# Patient Record
Sex: Male | Born: 1995
Health system: Southern US, Community
[De-identification: ages and names within clinical notes are randomized; demographics above are authoritative.]

## PROBLEM LIST (undated history)

## (undated) DIAGNOSIS — M6282 Rhabdomyolysis: Secondary | ICD-10-CM

## (undated) DIAGNOSIS — J45909 Unspecified asthma, uncomplicated: Secondary | ICD-10-CM

## (undated) HISTORY — DX: Rhabdomyolysis: M62.82

---
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
## 2016-03-13 IMAGING — DX DG HAND COMPLETE 3+V*R*
3 series · 3 of 3 positions shown · non-contrast
Comparison: None.

CLINICAL DATA: Punched a wall today

EXAM:
RIGHT HAND - COMPLETE 3+ VIEW

[hand pa]
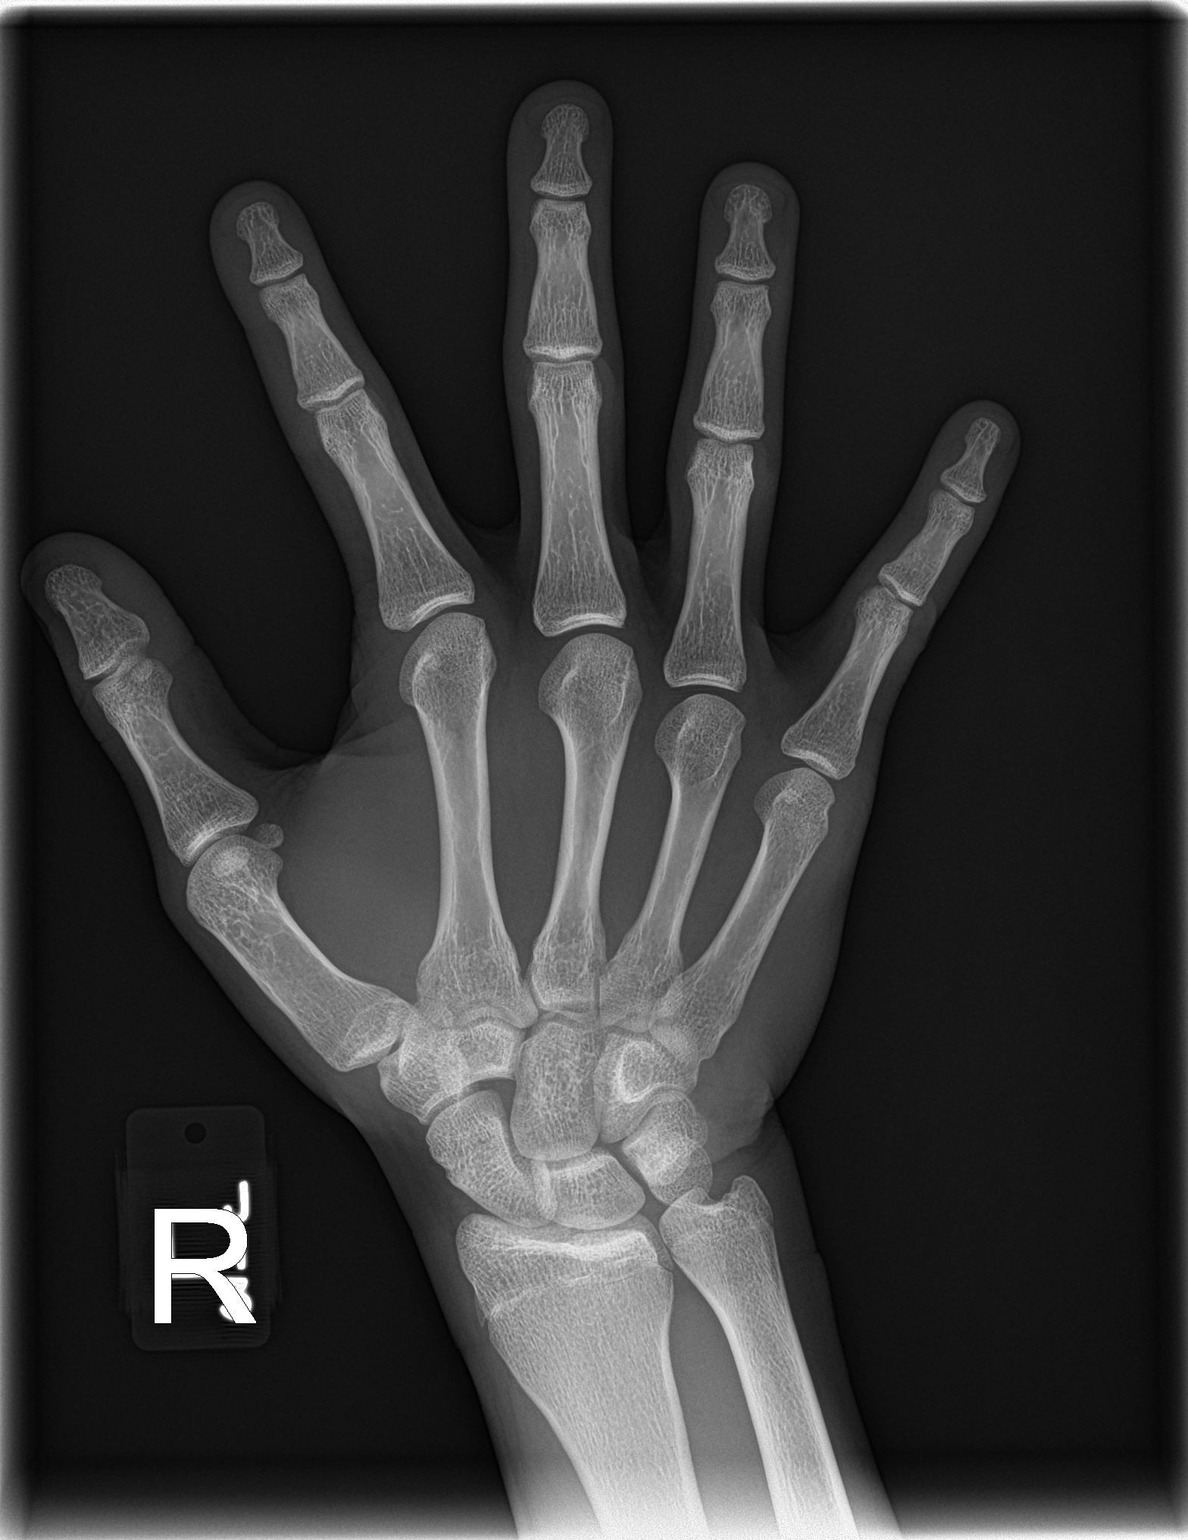

[hand obl]
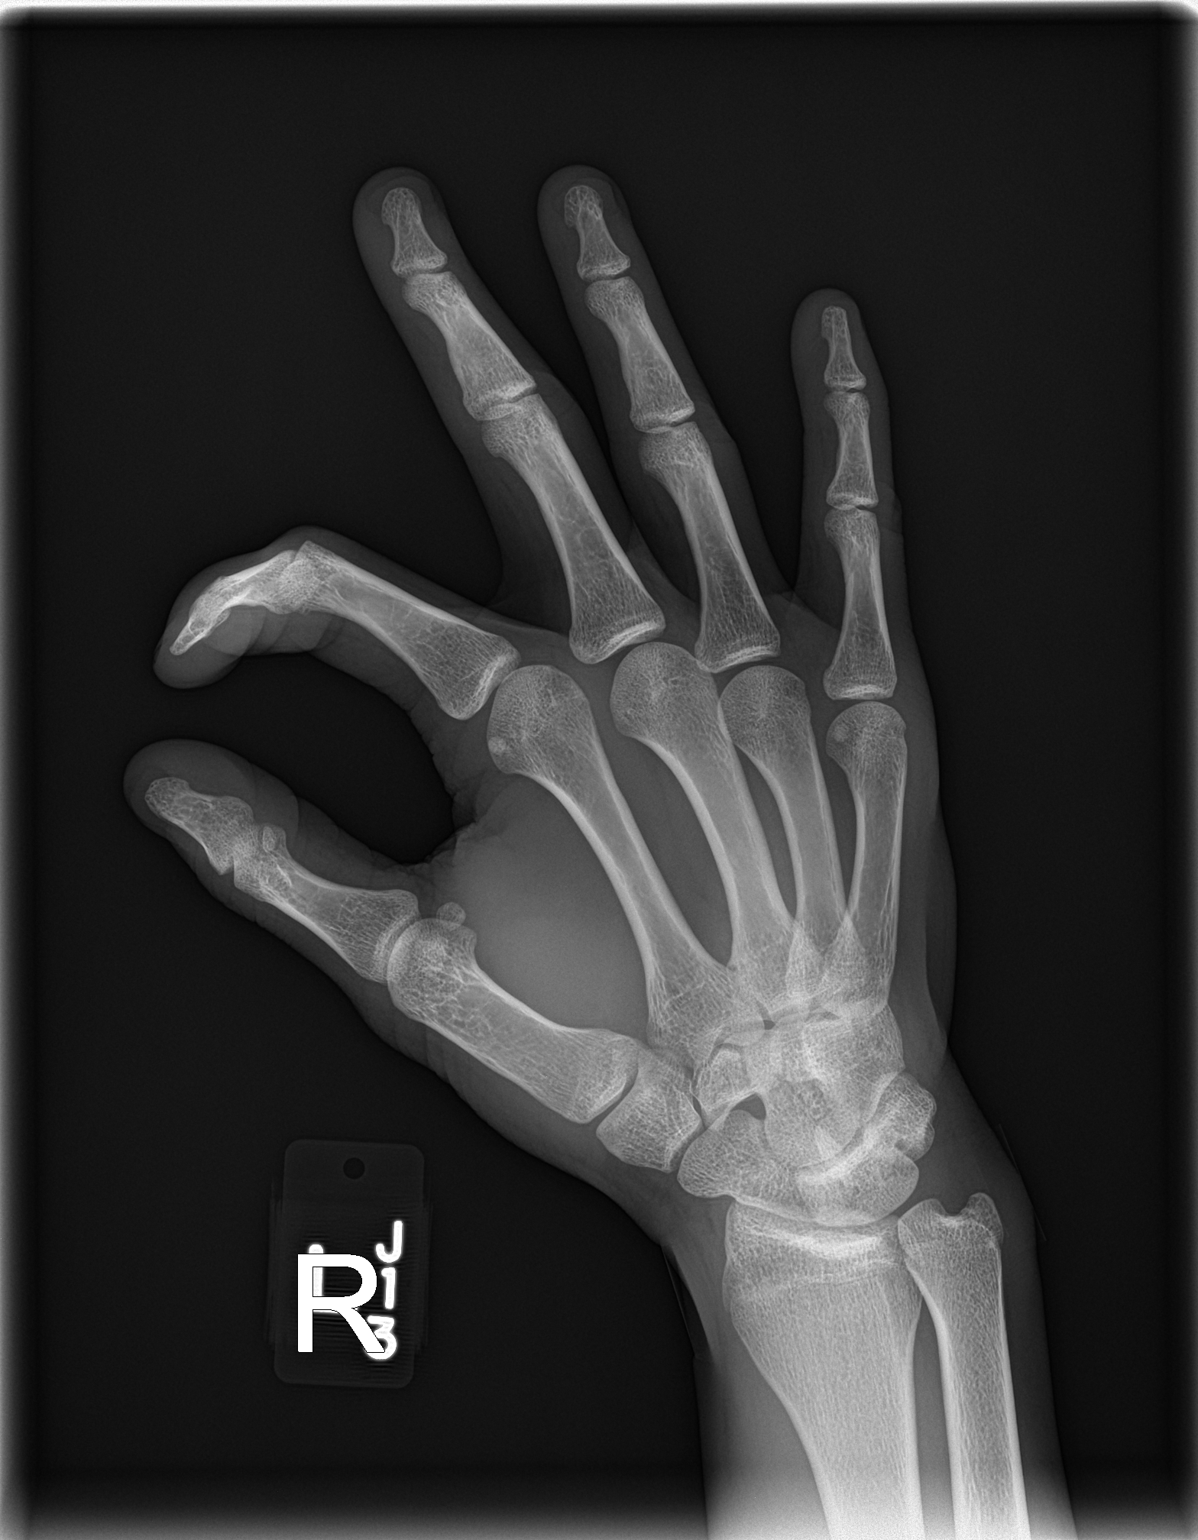

[hand lat]
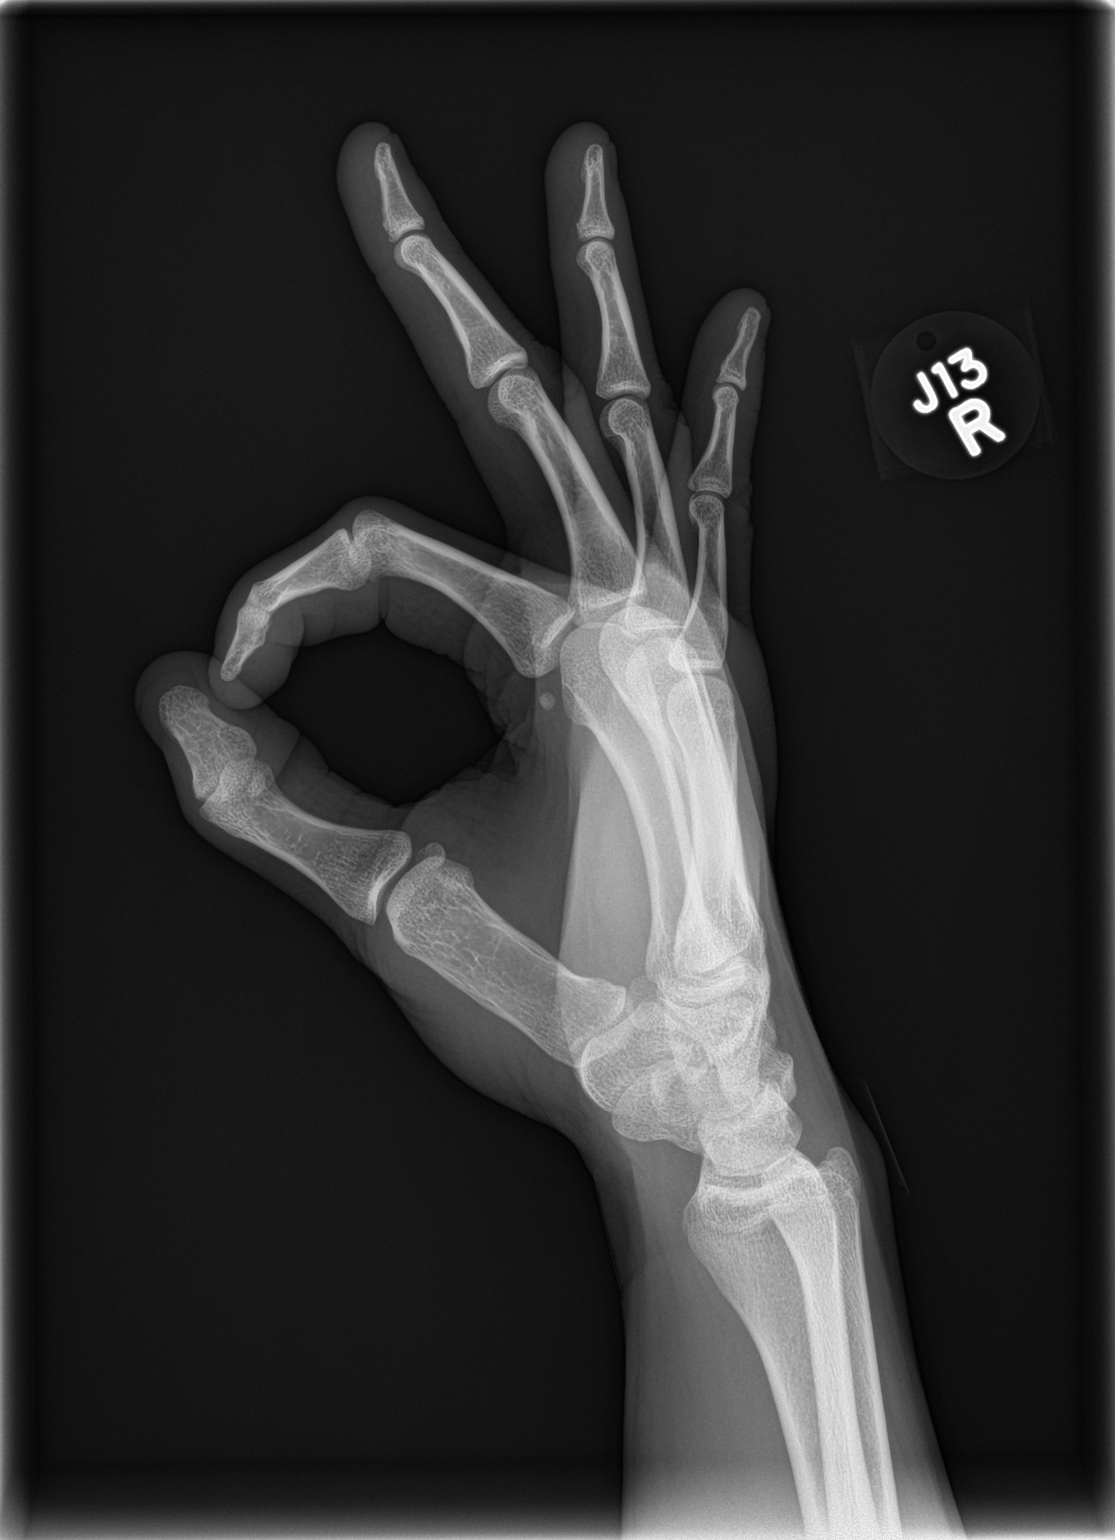

[3 of 3 positions shown; findings below may reference images not displayed]

FINDINGS: Three views of the right hand submitted. No acute fracture or
subluxation. No radiopaque foreign body.
IMPRESSION: Negative.

## 2016-05-06 DIAGNOSIS — E860 Dehydration: Secondary | ICD-10-CM | POA: Diagnosis not present

## 2016-05-06 DIAGNOSIS — T672XXA Heat cramp, initial encounter: Secondary | ICD-10-CM | POA: Diagnosis not present

## 2016-05-06 DIAGNOSIS — F17210 Nicotine dependence, cigarettes, uncomplicated: Secondary | ICD-10-CM | POA: Diagnosis not present

## 2016-12-19 ENCOUNTER — Emergency Department (HOSPITAL_COMMUNITY): Payer: 59

## 2016-12-19 ENCOUNTER — Emergency Department (HOSPITAL_COMMUNITY)
Admission: EM | Admit: 2016-12-19 | Discharge: 2016-12-19 | Disposition: A | Discharge status: Home / Self Care | Payer: 59 | Attending: Emergency Medicine | Admitting: Emergency Medicine

## 2016-12-19 ENCOUNTER — Encounter (HOSPITAL_COMMUNITY): Payer: Self-pay

## 2016-12-19 DIAGNOSIS — S52124A Nondisplaced fracture of head of right radius, initial encounter for closed fracture: Secondary | ICD-10-CM | POA: Insufficient documentation

## 2016-12-19 DIAGNOSIS — Y999 Unspecified external cause status: Secondary | ICD-10-CM | POA: Diagnosis not present

## 2016-12-19 DIAGNOSIS — Y9367 Activity, basketball: Secondary | ICD-10-CM | POA: Insufficient documentation

## 2016-12-19 DIAGNOSIS — Z91010 Allergy to peanuts: Secondary | ICD-10-CM | POA: Diagnosis not present

## 2016-12-19 DIAGNOSIS — F17200 Nicotine dependence, unspecified, uncomplicated: Secondary | ICD-10-CM | POA: Insufficient documentation

## 2016-12-19 DIAGNOSIS — S59911A Unspecified injury of right forearm, initial encounter: Secondary | ICD-10-CM | POA: Diagnosis present

## 2016-12-19 DIAGNOSIS — W1830XA Fall on same level, unspecified, initial encounter: Secondary | ICD-10-CM | POA: Diagnosis not present

## 2016-12-19 DIAGNOSIS — Z79899 Other long term (current) drug therapy: Secondary | ICD-10-CM | POA: Diagnosis not present

## 2016-12-19 DIAGNOSIS — J45909 Unspecified asthma, uncomplicated: Secondary | ICD-10-CM | POA: Diagnosis not present

## 2016-12-19 DIAGNOSIS — Y929 Unspecified place or not applicable: Secondary | ICD-10-CM | POA: Insufficient documentation

## 2016-12-19 HISTORY — DX: Unspecified asthma, uncomplicated: J45.909

## 2016-12-19 MED ORDER — HYDROCODONE-ACETAMINOPHEN 5-325 MG PO TABS: 2.0000 | ORAL_TABLET | ORAL | 0 refills | Status: DC | PRN

## 2016-12-19 MED ORDER — ONDANSETRON 4 MG PO TBDP: 4.0000 mg | ORAL_TABLET | Freq: Once | ORAL | Status: DC

## 2016-12-19 MED ORDER — IBUPROFEN 200 MG PO TABS
400.0000 mg | ORAL_TABLET | Freq: Once | ORAL | Status: AC
  Administered 2016-12-19: 400 mg via ORAL
  Filled 2016-12-19: qty 2

## 2016-12-19 MED ORDER — HYDROCODONE-ACETAMINOPHEN 5-325 MG PO TABS
1.0000 | ORAL_TABLET | Freq: Once | ORAL | Status: AC
  Administered 2016-12-19: 1 via ORAL
  Filled 2016-12-19: qty 1

## 2016-12-19 MED ORDER — IBUPROFEN 600 MG PO TABS: 600.0000 mg | ORAL_TABLET | Freq: Four times a day (QID) | ORAL | 0 refills | Status: DC | PRN

## 2016-12-19 NOTE — ED Notes (Signed)
Bed: WA05 Expected date:  Expected time:  Means of arrival:  Comments: 

## 2016-12-19 NOTE — Discharge Instructions (Signed)
Please call the hand surgeon listed on Monday morning to schedule a follow up appointment.  Ibuprofen as needed for mild to moderate pain. Pain medication only as needed for severe pain - This can make you very drowsy - please do not drink alcohol, operate heavy machinery or drive on this medication.   Check the fingertips several times per day to make sure they are not cold, pale, or blue. If this is the case, please return to ER for recheck. Return to ER for numbness, worsening pain, new or worsening symptoms, any additional concerns.

## 2016-12-19 NOTE — ED Triage Notes (Signed)
Fall while playing basketball about one hour ago now right elbow pain no deformity noted good circulation and sensation noted.

## 2016-12-19 NOTE — Progress Notes (Signed)
Orthopedic Tech Progress Note Patient Details:  Eino FarberJalen M Hester Nov 04, 1996 [id number]  Ortho Devices Type of Ortho Device: Arm sling, Post (long arm) splint Ortho Device/Splint Location: rue Ortho Device/Splint Interventions: Ordered, Application   Trinna PostMartinez, Joleen Stuckert J 12/19/2016, 3:09 AM

## 2016-12-19 NOTE — ED Provider Notes (Signed)
[facility]-EMERGENCY DEPT Provider Note   CSN: [id number] Arrival date & time: 12/19/16  0009  By signing my name below, I, Brian Patterson, attest that this documentation has been prepared under the direction and in the presence of non-physician practitioner, Brian SauerJaime Sebert Stollings, PA-C. Electronically Signed: Linna Darnerussell Patterson, Scribe. 12/19/2016. 1:54 AM.  History   Chief Complaint Chief Complaint  Patient presents with  . Fall    right elbow pain happened about 1 hour PTA no deformity noted.    The history is provided by the patient and a parent. No language interpreter was used.    HPI Comments: Brian Patterson is a right-hand dominant 21 y.o. male who presents to the Emergency Department complaining of sudden onset, constant, right elbow pain s/p falling about an hour PTA. He states he was playing basketball, fell, and landed on his right elbow. He states "I think I tried to catch myself" with his outstretched right arm. Pt denies hitting his head or losing consciousness. He reports severe pain to his right elbow radiating into his right upper arm. Pt states he cannot bend or extend his right elbow secondary to pain. He has received ibuprofen here for his elbow pain with no improvement. Father notes pt has seen an orthopedist in the past as a pediatric patient for left ankle, but never as an adult.   Past Medical History:  Diagnosis Date  . Asthma     There are no active problems to display for this patient.   History reviewed. No pertinent surgical history.     Home Medications    Prior to Admission medications   Medication Sig Start Date End Date Taking? Authorizing Provider  cetirizine (ZYRTEC) 10 MG tablet Take 1 tablet (10 mg total) by mouth daily. For 7 days for rash/itching 12/09/13   Brian ShayJamie Deis, MD  HYDROcodone-acetaminophen (NORCO/VICODIN) 5-325 MG tablet Take 2 tablets by mouth every 4 (four) hours as needed. 12/19/16   Brian Patterson Hubert Derstine, PA-C  hydrocortisone 2.5 % cream Apply  topically 2 (two) times daily. Apply to affected area twice daily for 7 days 12/09/13   Brian ShayJamie Deis, MD  ibuprofen (ADVIL,MOTRIN) 600 MG tablet Take 1 tablet (600 mg total) by mouth every 6 (six) hours as needed. 12/19/16   Brian Patterson Brian Nolton, PA-C    Family History History reviewed. No pertinent family history.  Social History Social History  Substance Use Topics  . Smoking status: Current Every Day Smoker  . Smokeless tobacco: Current User  . Alcohol use No     Allergies   Peanut-containing drug products   Review of Systems Review of Systems  Musculoskeletal: Positive for arthralgias and myalgias.  Skin: Negative for color change.  Neurological: Negative for syncope and numbness.  All other systems reviewed and are negative.    Physical Exam Updated Vital Signs BP 129/84   Pulse 63   Temp 98 F (36.7 C) (Oral)   Resp 18   Ht 6' (1.829 m)   Wt 75.3 kg   SpO2 100%   BMI 22.51 kg/m   Physical Exam  Constitutional: He is oriented to person, place, and time. He appears well-developed and well-nourished. No distress.  HENT:  Head: Normocephalic and atraumatic.  Eyes: Conjunctivae and EOM are normal.  Neck: Neck supple. No tracheal deviation present.  Cardiovascular: Normal rate.   Pulmonary/Chest: Effort normal. No respiratory distress.  Musculoskeletal: He exhibits tenderness.  TTP of the radial aspect of the right elbow. Decreased ROM secondary to pain. Good grip strength.  No TTP of right shoulder or wrist. Sensation intact to medial, ulnar, and radial nerve distributions. 2+ radial pulse. No open wounds.  Neurological: He is alert and oriented to person, place, and time.  Skin: Skin is warm and dry.  Psychiatric: He has a normal mood and affect. His behavior is normal.  Nursing note and vitals reviewed.    ED Treatments / Results  Labs (all labs ordered are listed, but only abnormal results are displayed) Labs Reviewed - No data to display  EKG  EKG  Interpretation None       Radiology Dg Elbow 2 Views Right  Result Date: 12/19/2016 CLINICAL DATA:  Patient fell while playing basketball yesterday. Right elbow injury with pain throughout the right elbow. EXAM: RIGHT ELBOW - 2 VIEW COMPARISON:  None. FINDINGS: There is a right elbow effusion with displacement of the anterior and posterior fat pads. Focal linear lucency demonstrated in the radial head extending to the articular surface consistent with a nondisplaced acute fracture. Distal humerus and proximal ulna appear intact. No dislocation of the elbow. IMPRESSION: Nondisplaced fracture of the right radial head with associated right elbow effusion. Electronically Signed   By: Brian Patterson M.D.   On: 12/19/2016 00:46    Procedures Procedures (including critical care time)  DIAGNOSTIC STUDIES: Oxygen Saturation is 100% on RA, normal by my interpretation.    COORDINATION OF CARE: 2:02 AM Discussed treatment plan with pt and his father at bedside and they agreed to plan.  Medications Ordered in ED Medications  ibuprofen (ADVIL,MOTRIN) tablet 400 mg (400 mg Oral Given 12/19/16 0022)  HYDROcodone-acetaminophen (NORCO/VICODIN) 5-325 MG per tablet 1 tablet (1 tablet Oral Given 12/19/16 0227)     Initial Impression / Assessment and Plan / ED Course  I have reviewed the triage vital signs and the nursing notes.  Pertinent labs & imaging results that were available during my care of the patient were reviewed by me and considered in my medical decision making (see chart for details).    Brian Patterson is a 21 y.o. male who presents to ED for right elbow pain after a fall playing basketball. RUE with 2+ radial pulse, sensation intact, good grip strength. X-ray obtained which shows nondisplaced fracture of the right radial head with associated right elbow effusion. Hand, Dr. Mina Marble, consulted. Splint and follow up in the office. Splint applied and patient re-evaluated following splint  placement. Patient comfortable in splint. Able to moves fingers without difficulty. Good cap refill. No numbness. Strict return precautions discussed with patient and father at bedside. Follow up care discussed. All questions answered.   Patient discussed with Dr. Nicanor Alcon who agrees with treatment plan.   Final Clinical Impressions(s) / ED Diagnoses   Final diagnoses:  Closed nondisplaced fracture of head of right radius, initial encounter    New Prescriptions Discharge Medication List as of 12/19/2016  3:32 AM     I personally performed the services described in this documentation, which was scribed in my presence. The recorded information has been reviewed and is accurate.    [facility] Chi Garlow, PA-C 12/19/16 0541    Cy Blamer, MD 12/19/16 [phone]

## 2016-12-22 DIAGNOSIS — S52125A Nondisplaced fracture of head of left radius, initial encounter for closed fracture: Secondary | ICD-10-CM | POA: Diagnosis not present

## 2017-01-03 DIAGNOSIS — S52125D Nondisplaced fracture of head of left radius, subsequent encounter for closed fracture with routine healing: Secondary | ICD-10-CM | POA: Diagnosis not present

## 2017-04-09 DIAGNOSIS — K2970 Gastritis, unspecified, without bleeding: Secondary | ICD-10-CM | POA: Diagnosis not present

## 2017-04-09 DIAGNOSIS — J45909 Unspecified asthma, uncomplicated: Secondary | ICD-10-CM | POA: Diagnosis not present

## 2017-04-09 DIAGNOSIS — R112 Nausea with vomiting, unspecified: Secondary | ICD-10-CM | POA: Diagnosis not present

## 2017-04-09 DIAGNOSIS — R252 Cramp and spasm: Secondary | ICD-10-CM | POA: Diagnosis not present

## 2017-04-09 DIAGNOSIS — M6282 Rhabdomyolysis: Secondary | ICD-10-CM | POA: Diagnosis not present

## 2017-04-09 DIAGNOSIS — T672XXA Heat cramp, initial encounter: Secondary | ICD-10-CM | POA: Diagnosis not present

## 2017-04-09 DIAGNOSIS — F17210 Nicotine dependence, cigarettes, uncomplicated: Secondary | ICD-10-CM | POA: Diagnosis not present

## 2017-04-09 LAB — BASIC METABOLIC PANEL
BUN: 10 mg/dL (ref 4–21)
Creatinine: 1.4 mg/dL — AB (ref <=1.3)
GLUCOSE: 76 mg/dL
Potassium: 4.1 mmol/L (ref 3.4–5.3)
Sodium: 138 mmol/L (ref 137–147)

## 2017-04-13 ENCOUNTER — Ambulatory Visit (INDEPENDENT_AMBULATORY_CARE_PROVIDER_SITE_OTHER): Payer: 59 | Admitting: Family Medicine

## 2017-04-13 ENCOUNTER — Encounter: Payer: Self-pay | Admitting: Family Medicine

## 2017-04-13 VITALS — BP 116/68 | HR 73 | Temp 97.5°F | Ht 69.0 in | Wt 164.8 lb

## 2017-04-13 DIAGNOSIS — M6282 Rhabdomyolysis: Secondary | ICD-10-CM | POA: Diagnosis not present

## 2017-04-13 HISTORY — DX: Rhabdomyolysis: M62.82

## 2017-04-13 LAB — COMPREHENSIVE METABOLIC PANEL
ALT: 15 U/L (ref 0–53)
AST: 23 U/L (ref 0–37)
Albumin: 4.9 g/dL (ref 3.5–5.2)
Alkaline Phosphatase: 67 U/L (ref 52–171)
BUN: 12 mg/dL (ref 6–23)
CO2: 29 mEq/L (ref 19–32)
Calcium: 10.1 mg/dL (ref 8.4–10.5)
Chloride: 104 mEq/L (ref 96–112)
Creatinine, Ser: 1.17 mg/dL (ref 0.40–1.50)
GFR: 102.46 mL/min (ref >60.00)
Glucose, Bld: 81 mg/dL (ref 70–99)
Potassium: 4.1 mEq/L (ref 3.5–5.1)
Sodium: 138 mEq/L (ref 135–145)
Total Bilirubin: 0.5 mg/dL (ref 0.2–1.2)
Total Protein: 8.1 g/dL (ref 6.0–8.3)

## 2017-04-13 LAB — CK TOTAL AND CKMB (NOT AT ARMC)
CK, MB: 0.9 ng/mL (ref 0.0–5.0)
Relative Index: 0.2 (ref 0.0–4.0)
Total CK: 484 U/L — ABNORMAL HIGH (ref 44–196)

## 2017-04-13 NOTE — Progress Notes (Signed)
Brian Patterson is a 21 y.o. male is here to [facility].   Patient Care Team: Brian Rima, DO as PCP - General (Family Medicine)   History of Present Illness:   Brian Patterson CMA acting as scribe for Dr. Earlene Plater.  HPI Patient comes in to establish care. He has recently been diagnosed with rhabdomyolysis. This was caused by playing softball in the heat, with moderate dehydration. He went to the ER. His total CK was 544, Creatinine was 1.4 on 04/09/17. He was given IV fluids and told to follow up for repeat labs today. No other concerns today.   He has been drinking lots of water. No pain. No medications or supplements. No pertinent personal or family history.  Health Maintenance Due  Topic Date Due  . HIV Screening  07/07/2012  . TETANUS/TDAP  07/07/2016   PMHx, SurgHx, SocialHx, Medications, and Allergies were reviewed in the Visit Navigator and updated as appropriate.   Past Medical History:  Diagnosis Date  . Asthma    No past surgical history on file. No family history on file. Social History  Substance Use Topics  . Smoking status: Current Every Day Smoker  . Smokeless tobacco: Current User  . Alcohol use No   Current Medications and Allergies:   No current outpatient prescriptions on file.  Allergies  Allergen Reactions  . Peanut-Containing Drug Products    Review of Systems:   Review of Systems  Constitutional: Negative for chills, fever and malaise/fatigue.  HENT: Negative for ear pain, sinus pain and sore throat.   Eyes: Negative for blurred vision and double vision.  Respiratory: Negative for cough, shortness of breath and wheezing.   Cardiovascular: Negative for chest pain, palpitations and leg swelling.  Gastrointestinal: Negative for abdominal pain, nausea and vomiting.  Musculoskeletal: Negative for back pain, joint pain and neck pain.  Neurological: Negative for dizziness and headaches.  Psychiatric/Behavioral: Negative for depression,  hallucinations and memory loss.   Vitals:   Vitals:   04/13/17 1437  BP: 116/68  Pulse: 73  Temp: 97.5 F (36.4 C)  TempSrc: Oral  SpO2: 96%  Weight: 164 lb 12.8 oz (74.8 kg)  Height: 5\' 9"  (1.753 m)     Body mass index is 24.34 kg/m.  Physical Exam:   Physical Exam  Constitutional: He is oriented to person, place, and time. He appears well-developed and well-nourished.  HENT:  Head: Normocephalic and atraumatic.  Right Ear: External ear normal.  Left Ear: External ear normal.  Nose: Nose normal.  Mouth/Throat: Oropharynx is clear and moist.  Eyes: Conjunctivae and EOM are normal. Pupils are equal, round, and reactive to light.  Neck: Normal range of motion. Neck supple.  Cardiovascular: Normal rate, regular rhythm, normal heart sounds and intact distal pulses.   Pulmonary/Chest: Effort normal and breath sounds normal.  Abdominal: Soft. Bowel sounds are normal.  Musculoskeletal: Normal range of motion.  Neurological: He is alert and oriented to person, place, and time.  Skin: Skin is warm and dry.  Psychiatric: He has a normal mood and affect. His behavior is normal. Judgment and thought content normal.  Nursing note and vitals reviewed.  Assessment and Plan:   Graycen was seen today for establish care.  Diagnoses and all orders for this visit:  Non-traumatic rhabdomyolysis Comments: No concerns on exam. Likely trending down. STAT labs ordered below. Continue hydration. Orders: -     CK Total (and CKMB) -     Comprehensive metabolic panel   . Reviewed  expectations re: course of current medical issues. . Discussed self-management of symptoms. . Outlined signs and symptoms indicating need for more acute intervention. . Patient verbalized understanding and all questions were answered. Marland Kitchen. Health Maintenance issues including appropriate healthy diet, exercise, and smoking avoidance were discussed with patient. . See orders for this visit as documented in the  electronic medical record. . Patient received an After Visit Summary.  CMA served as Neurosurgeonscribe during this visit. History, Physical, and Plan performed by medical provider. The above documentation has been reviewed and is accurate and complete. Brian RimaErica Triston Lisanti, D.O.  Brian RimaErica Lesette Frary, DO [place] [place] 04/13/2017  Future Appointments Date Time Provider Department Center  04/13/2017 3:15 PM Brian RimaWallace, Marleah Beever, DO [facility] None

## 2017-04-18 ENCOUNTER — Encounter: Payer: Self-pay | Admitting: Family Medicine

## 2017-04-19 ENCOUNTER — Other Ambulatory Visit: Payer: Self-pay

## 2017-04-19 ENCOUNTER — Telehealth: Payer: Self-pay

## 2017-04-19 MED ORDER — VALACYCLOVIR HCL 1 G PO TABS: 1000.0000 mg | ORAL_TABLET | Freq: Three times a day (TID) | ORAL | 0 refills | Status: AC

## 2017-04-19 NOTE — Telephone Encounter (Signed)
I agree with the documentation and plan recorded by [employer] Agner, CMA.  Jarold MottoSamantha Yuliana Vandrunen PA-C 04/19/17

## 2017-04-19 NOTE — Telephone Encounter (Signed)
Patient sent MyChart message with pictures attached that appear to show that he has the shingles.  I spoke with patient and he said the rash is painful and is on his front and back but only on one side of his body.  It is not oozing at this time.  Spoke with Jarold MottoSamantha Worley, PA, and she agreed that patient's rash and symptoms are consistent with the shingles.  Valacyclovir 1000 mg three times daily x7 days was sent to patient's pharmacy.  Advised patient to avoid babies, small children, pregnant women, or anyone who may have a compromised immune system.  Patient verbalized understanding.

## 2017-05-23 ENCOUNTER — Encounter: Payer: Self-pay | Admitting: Family Medicine

## 2017-05-23 NOTE — Telephone Encounter (Signed)
White discharge from penis with some discomfort.  Scheduled acute 07/17 at 02:15pm.   -LL

## 2017-05-24 ENCOUNTER — Other Ambulatory Visit (HOSPITAL_COMMUNITY)
Admission: RE | Admit: 2017-05-24 | Discharge: 2017-05-24 | Disposition: A | Discharge status: Home / Self Care | Payer: 59 | Source: Ambulatory Visit | Attending: Family Medicine | Admitting: Family Medicine

## 2017-05-24 ENCOUNTER — Ambulatory Visit (INDEPENDENT_AMBULATORY_CARE_PROVIDER_SITE_OTHER): Payer: 59 | Admitting: Family Medicine

## 2017-05-24 ENCOUNTER — Encounter: Payer: Self-pay | Admitting: Family Medicine

## 2017-05-24 VITALS — BP 124/78 | HR 51 | Temp 98.4°F | Ht 69.0 in | Wt 163.4 lb

## 2017-05-24 DIAGNOSIS — Z202 Contact with and (suspected) exposure to infections with a predominantly sexual mode of transmission: Secondary | ICD-10-CM | POA: Insufficient documentation

## 2017-05-24 DIAGNOSIS — Z23 Encounter for immunization: Secondary | ICD-10-CM | POA: Diagnosis not present

## 2017-05-24 DIAGNOSIS — A549 Gonococcal infection, unspecified: Secondary | ICD-10-CM | POA: Diagnosis not present

## 2017-05-24 MED ORDER — CEFTRIAXONE SODIUM 250 MG IJ SOLR
250.0000 mg | Freq: Once | INTRAMUSCULAR | Status: AC
  Administered 2017-05-24: 250 mg via INTRAMUSCULAR

## 2017-05-24 MED ORDER — MENINGOCOCCAL A C Y&W-135 OLIG IM SOLR: 0.5000 mL | Freq: Once | INTRAMUSCULAR | Status: DC

## 2017-05-24 MED ORDER — AZITHROMYCIN 250 MG PO TABS
1000.0000 mg | ORAL_TABLET | Freq: Once | ORAL | Status: AC
  Administered 2017-05-24: 1000 mg via ORAL

## 2017-05-24 NOTE — Progress Notes (Signed)
Brian Patterson is a 21 y.o. male here for an acute visit.  History of Present Illness:   Insurance claims handler, CMA, acting as scribe for Dr. Earlene Plater.  HPI: Patient is here today because he thinks he has an STD.  Symptoms began 1-2 weeks ago.  He is having penile discharge and some discomfort.  States his mother also wants him to get the HPV and meningococcal vaccines.  Patient is accompanied in the room today by his grandfather.  PMHx, SurgHx, SocialHx, Medications, and Allergies were reviewed in the Visit Navigator and updated as appropriate.  Current Medications:  No current outpatient prescriptions on file.  Allergies  Allergen Reactions  . Peanut-Containing Drug Products    Review of Systems:   Pertinent items are noted in the HPI. Otherwise, ROS is negative.  Vitals:   Vitals:   05/24/17 1420  BP: 124/78  Pulse: (!) 51  Temp: 98.4 F (36.9 C)  TempSrc: Oral  SpO2: 98%  Weight: 163 lb 6.4 oz (74.1 kg)  Height: 5\' 9"  (1.753 m)     Body mass index is 24.13 kg/m.  Physical Exam:   Physical Exam  Constitutional: He is oriented to person, place, and time. He appears well-developed and well-nourished. No distress.  HENT:  Head: Normocephalic and atraumatic.  Eyes: Pupils are equal, round, and reactive to light. Conjunctivae and EOM are normal.  Neck: Normal range of motion. Neck supple.  Cardiovascular: Normal rate, regular rhythm, normal heart sounds and intact distal pulses.   Pulmonary/Chest: Effort normal and breath sounds normal.  Abdominal: Soft. Bowel sounds are normal.  Musculoskeletal: Normal range of motion.  Neurological: He is alert and oriented to person, place, and time.  Skin: Skin is warm and dry.  Psychiatric: He has a normal mood and affect. His behavior is normal. Judgment and thought content normal.  Nursing note and vitals reviewed.  Results for orders placed or performed in visit on 05/24/17  HIV antibody  Result Value Ref Range   HIV 1&2 Ab, 4th  Generation NONREACTIVE NONREACTIVE  RPR  Result Value Ref Range   RPR Ser Ql NON REAC NON REAC   Assessment and Plan:   [name] was seen today for acute visit.  Diagnoses and all orders for this visit:  STD exposure -     cefTRIAXone (ROCEPHIN) injection 250 mg; Inject 250 mg into the muscle once. -     Urine cytology ancillary only -     HIV antibody -     RPR -     azithromycin (ZITHROMAX) tablet 1,000 mg; Take 4 tablets (1,000 mg total) by mouth once.  Need for HPV vaccination -     HPV 9-valent vaccine,Recombinat  Need for meningitis vaccination -     MENINGOCOCCAL MCV4O(MENVEO)  . Reviewed expectations re: course of current medical issues. . Discussed self-management of symptoms. . Outlined signs and symptoms indicating need for more acute intervention. . Patient verbalized understanding and all questions were answered. Marland Kitchen Health Maintenance issues including appropriate healthy diet, exercise, and smoking avoidance were discussed with patient. . See orders for this visit as documented in the electronic medical record. . Patient received an After Visit Summary.  CMA served as Neurosurgeon during this visit. History, Physical, and Plan performed by medical provider. The above documentation has been reviewed and is accurate and complete. Helane Rima, D.O.  Helane Rima, DO [place] [place] 05/25/2017  Future Appointments Date Time Provider Department Center  06/24/2017 2:15 PM [facility] NURSE  [facility] None  11/24/2017 2:15 PM [facility] NURSE [facility] None

## 2017-05-25 ENCOUNTER — Encounter: Payer: Self-pay | Admitting: Family Medicine

## 2017-05-25 LAB — HIV ANTIBODY (ROUTINE TESTING W REFLEX): HIV 1&2 Ab, 4th Generation: NONREACTIVE

## 2017-05-25 LAB — RPR

## 2017-05-26 ENCOUNTER — Telehealth: Payer: Self-pay | Admitting: Family Medicine

## 2017-05-26 LAB — URINE CYTOLOGY ANCILLARY ONLY
Chlamydia: NEGATIVE
Neisseria Gonorrhea: POSITIVE — AB
Trichomonas: NEGATIVE

## 2017-05-26 NOTE — Telephone Encounter (Signed)
Patient returning phone call about lab results, I advised patient that they were available on his mychart and to call with any additional questions if necessary.

## 2017-05-26 NOTE — Telephone Encounter (Signed)
Noted  

## 2017-06-24 ENCOUNTER — Ambulatory Visit: Payer: 59

## 2017-11-24 ENCOUNTER — Ambulatory Visit: Payer: 59

## 2017-12-16 ENCOUNTER — Ambulatory Visit: Payer: Self-pay | Admitting: Family Medicine

## 2017-12-17 ENCOUNTER — Encounter (HOSPITAL_COMMUNITY): Payer: Self-pay | Admitting: Emergency Medicine

## 2017-12-17 ENCOUNTER — Ambulatory Visit (HOSPITAL_COMMUNITY)
Admission: EM | Admit: 2017-12-17 | Discharge: 2017-12-17 | Disposition: A | Discharge status: Home / Self Care | Payer: No Typology Code available for payment source | Attending: Internal Medicine | Admitting: Internal Medicine

## 2017-12-17 DIAGNOSIS — J029 Acute pharyngitis, unspecified: Secondary | ICD-10-CM

## 2017-12-17 DIAGNOSIS — R6889 Other general symptoms and signs: Secondary | ICD-10-CM | POA: Diagnosis not present

## 2017-12-17 DIAGNOSIS — R509 Fever, unspecified: Secondary | ICD-10-CM | POA: Insufficient documentation

## 2017-12-17 DIAGNOSIS — Z202 Contact with and (suspected) exposure to infections with a predominantly sexual mode of transmission: Secondary | ICD-10-CM | POA: Insufficient documentation

## 2017-12-17 DIAGNOSIS — J020 Streptococcal pharyngitis: Secondary | ICD-10-CM

## 2017-12-17 DIAGNOSIS — J069 Acute upper respiratory infection, unspecified: Secondary | ICD-10-CM | POA: Insufficient documentation

## 2017-12-17 DIAGNOSIS — J45909 Unspecified asthma, uncomplicated: Secondary | ICD-10-CM | POA: Diagnosis not present

## 2017-12-17 DIAGNOSIS — F17200 Nicotine dependence, unspecified, uncomplicated: Secondary | ICD-10-CM | POA: Insufficient documentation

## 2017-12-17 LAB — POCT RAPID STREP A: STREPTOCOCCUS, GROUP A SCREEN (DIRECT): NEGATIVE

## 2017-12-17 MED ORDER — AMOXICILLIN 500 MG PO CAPS: 500.0000 mg | ORAL_CAPSULE | Freq: Two times a day (BID) | ORAL | 0 refills | Status: AC

## 2017-12-17 MED ORDER — ACETAMINOPHEN 325 MG PO TABS
ORAL_TABLET | ORAL | Status: AC
  Filled 2017-12-17: qty 2

## 2017-12-17 MED ORDER — ACETAMINOPHEN 325 MG PO TABS
650.0000 mg | ORAL_TABLET | Freq: Once | ORAL | Status: AC
  Administered 2017-12-17: 650 mg via ORAL

## 2017-12-17 NOTE — ED Provider Notes (Signed)
[facility]    CSN: [id number] Arrival date & time: 12/17/17  1437     History   Chief Complaint Chief Complaint  Patient presents with  . URI    HPI Brian Patterson is a 22 y.o. male.   Presents with a 3 day history of sore throat, fevers, chills, nausea and vomiting. No cough. Some body aches with his fever. No know exposures. No flu vaccine.       Past Medical History:  Diagnosis Date  . Asthma   . Non-traumatic rhabdomyolysis 04/13/2017   No concerns on exam. Likely trending down. STAT labs ordered below. Continue hydration.    Patient Active Problem List   Diagnosis Date Noted  . STD exposure 05/24/2017    History reviewed. No pertinent surgical history.     Home Medications    Prior to Admission medications   Not on File    Family History History reviewed. No pertinent family history.  Social History Social History   Tobacco Use  . Smoking status: Current Every Day Smoker  . Smokeless tobacco: Current User  Substance Use Topics  . Alcohol use: No  . Drug use: No     Allergies   Peanut-containing drug products   Review of Systems Review of Systems  Constitutional: Positive for chills, fatigue and fever.  HENT: Positive for sore throat. Negative for congestion, rhinorrhea and sinus pain.   Eyes: Negative.   Respiratory: Negative for cough and shortness of breath.   Musculoskeletal: Positive for arthralgias.  Skin: Negative for rash.     Physical Exam Triage Vital Signs ED Triage Vitals  Enc Vitals Group     BP 12/17/17 1607 133/61     Pulse Rate 12/17/17 1607 77     Resp 12/17/17 1607 20     Temp 12/17/17 1607 (!) 102.1 F (38.9 C)     Temp Source 12/17/17 1607 Oral     SpO2 12/17/17 1607 100 %     Weight --      Height --      Head Circumference --      Peak Flow --      Pain Score 12/17/17 1606 10     Pain Loc --      Pain Edu? --      Excl. in GC? --    No data found.  Updated Vital Signs BP 133/61 (BP  Location: Left Arm)   Pulse 77   Temp (!) 102.1 F (38.9 C) (Oral)   Resp 20   SpO2 100%   Visual Acuity Right Eye Distance:   Left Eye Distance:   Bilateral Distance:    Right Eye Near:   Left Eye Near:    Bilateral Near:     Physical Exam  Constitutional: He is oriented to person, place, and time. He appears well-developed and well-nourished. No distress.  Appears ill  HENT:  Right Ear: External ear normal.  Left Ear: External ear normal.  Mouth/Throat: Oropharyngeal exudate present.  +3 tonsils-+exudate and erythema  Neck: Normal range of motion. Neck supple.  Cardiovascular: Regular rhythm.  Pulmonary/Chest: Effort normal and breath sounds normal. He has no wheezes. He has no rales.  Neurological: He is alert and oriented to person, place, and time.  Skin: Skin is warm and dry. He is not diaphoretic.  Nursing note and vitals reviewed.    UC Treatments / Results  Labs (all labs ordered are listed, but only abnormal results are displayed) Labs Reviewed -  No data to display  EKG  EKG Interpretation None       Radiology No results found.  Procedures Procedures (including critical care time)  Medications Ordered in UC Medications  acetaminophen (TYLENOL) tablet 650 mg (650 mg Oral Given 12/17/17 1609)     Initial Impression / Assessment and Plan / UC Course  I have reviewed the triage vital signs and the nursing notes.  Pertinent labs & imaging results that were available during my care of the patient were reviewed by me and considered in my medical decision making (see chart for details).     Clinical presentation of strep. Treat with amoxicillin and supportive care. FU if worsens or concerns.   Final Clinical Impressions(s) / UC Diagnoses   Final diagnoses:  None    ED Discharge Orders    None       Controlled Substance Prescriptions [place] Controlled Substance Registry consulted? Not Applicable   Riki Sheer, PA-C 12/17/17 [id number]

## 2017-12-17 NOTE — ED Triage Notes (Signed)
PT C/O: cold sx onset 3-4 days associated w/nausea, vomiting, fever, HA, sore throat, body aches  TAKING MEDS: Theraflu and OTC cold meds  A&O x4... NAD... Ambulatory

## 2017-12-17 NOTE — Discharge Instructions (Signed)
He could have had the flu and now out of window for treatment, but his clinical exam looks like strep throat and will treat as such. Treat with Amoxicillin x 10 days, Advil 800mg  every 8 hours for fevers and chills. Push fluids and rest. Hope you feel better.

## 2017-12-20 LAB — CULTURE, GROUP A STREP (THRC)

## 2018-01-02 ENCOUNTER — Ambulatory Visit: Payer: No Typology Code available for payment source

## 2018-04-24 ENCOUNTER — Encounter: Payer: Self-pay | Admitting: Family Medicine

## 2018-04-24 ENCOUNTER — Ambulatory Visit (INDEPENDENT_AMBULATORY_CARE_PROVIDER_SITE_OTHER): Payer: No Typology Code available for payment source | Admitting: Family Medicine

## 2018-04-24 VITALS — BP 126/74 | HR 57 | Temp 98.6°F | Ht 69.0 in | Wt 171.4 lb

## 2018-04-24 DIAGNOSIS — J3489 Other specified disorders of nose and nasal sinuses: Secondary | ICD-10-CM | POA: Diagnosis not present

## 2018-04-24 DIAGNOSIS — H00015 Hordeolum externum left lower eyelid: Secondary | ICD-10-CM

## 2018-04-24 MED ORDER — ERYTHROMYCIN 5 MG/GM OP OINT: 1.0000 "application " | TOPICAL_OINTMENT | Freq: Every day | OPHTHALMIC | 0 refills | Status: DC

## 2018-04-24 MED FILL — ERYTHROMYCIN 0.5% EYE OINT: 5 | 20 days supply | Qty: 4 | Fill #0

## 2018-04-24 NOTE — Progress Notes (Signed)
   Subjective:  Brian Patterson is a 22 y.o. male who presents today for same-day appointment with a chief complaint of eyelid lesion.   HPI:  Eyelid lesion, acute problem Started 2 days ago.  Worsened over that time.  Located on lower left eyelid.  Patient has painful.  Also has swelling to the area.  Tried warm compresses which have not helped.  No blurred vision.  No photophobia.  No pain with eye movements.  No other treatments tried.  No obvious precipitating events.  No other obvious alleviating or aggravating factors.  Rhinorrhea, acute problem Started about 1 week ago.has an occasional nonproductive cough.  No shortness of breath.  No fever chills.  No facial pain aside from the lesion above.  ROS: Per HPI  PMH: He reports that he has been smoking.  He uses smokeless tobacco. He reports that he does not drink alcohol or use drugs.  Objective:  Physical Exam: BP 126/74 (BP Location: Right Arm, Patient Position: Sitting, Cuff Size: Normal)   Pulse (!) 57   Temp 98.6 F (37 C) (Oral)   Ht 5\' 9"  (1.753 m)   Wt 171 lb 6.4 oz (77.7 kg)   SpO2 99%   BMI 25.31 kg/m   Gen: NAD, resting comfortably HEENT: Left lower eyelid erythematous and edematous.  Nose mucosa boggy and erythematous bilaterally with watery green discharge.  Assessment/Plan:  Stye/blepharitis No red flag signs or symptoms.  Given surrounding blepharitis, will start topical erythromycin today.  Encouraged patient to continue using warm compresses.  Also recommended gentle massage to the area.  Discussed reasons to return to care.  If no improvement after 1 to 2 weeks, would consider referral to ophthalmology for I&D.  Rhinorrhea Likely secondary to seasonal allergies versus viral URI.  Patient deferred further treatment at this point.  Discussed reasons to return to care.  Follow-up as needed.  Katina Degreealeb M. Jimmey RalphParker, MD 04/24/2018 2:43 PM

## 2018-04-24 NOTE — Patient Instructions (Addendum)
It was very nice to see you today!  Please use the ointment every night.  Continue using warm compresses. You can also gently massage the area to help it drain.  Please let me or Dr Earlene PlaterWallace know if your symptoms worse or do not improve over the next several days.   Take care, Dr Jimmey RalphParker

## 2019-11-08 ENCOUNTER — Encounter (HOSPITAL_COMMUNITY): Payer: Self-pay | Admitting: Emergency Medicine

## 2019-11-08 ENCOUNTER — Emergency Department (HOSPITAL_COMMUNITY)
Admission: EM | Admit: 2019-11-08 | Discharge: 2019-11-09 | Disposition: A | Discharge status: Home / Self Care | Payer: Self-pay | Attending: Emergency Medicine | Admitting: Emergency Medicine

## 2019-11-08 ENCOUNTER — Emergency Department (HOSPITAL_COMMUNITY): Payer: Self-pay

## 2019-11-08 DIAGNOSIS — J45909 Unspecified asthma, uncomplicated: Secondary | ICD-10-CM | POA: Insufficient documentation

## 2019-11-08 DIAGNOSIS — T424X1A Poisoning by benzodiazepines, accidental (unintentional), initial encounter: Secondary | ICD-10-CM | POA: Insufficient documentation

## 2019-11-08 DIAGNOSIS — R0789 Other chest pain: Secondary | ICD-10-CM | POA: Insufficient documentation

## 2019-11-08 DIAGNOSIS — F17290 Nicotine dependence, other tobacco product, uncomplicated: Secondary | ICD-10-CM | POA: Insufficient documentation

## 2019-11-08 DIAGNOSIS — R569 Unspecified convulsions: Secondary | ICD-10-CM | POA: Insufficient documentation

## 2019-11-08 LAB — COMPREHENSIVE METABOLIC PANEL
ALT: 19 U/L (ref 0–44)
AST: 30 U/L (ref 15–41)
Albumin: 4.3 g/dL (ref 3.5–5.0)
Alkaline Phosphatase: 51 U/L (ref 38–126)
Anion gap: 13 (ref 5–15)
BUN: 13 mg/dL (ref 6–20)
CO2: 19 mmol/L — ABNORMAL LOW (ref 22–32)
Calcium: 9.2 mg/dL (ref 8.9–10.3)
Chloride: 103 mmol/L (ref 98–111)
Creatinine, Ser: 1.56 mg/dL — ABNORMAL HIGH (ref 0.61–1.24)
GFR calc Af Amer: >60 mL/min (ref >60)
GFR calc non Af Amer: >60 mL/min (ref >60)
Glucose, Bld: 165 mg/dL — ABNORMAL HIGH (ref 70–99)
Potassium: 3.6 mmol/L (ref 3.5–5.1)
Sodium: 135 mmol/L (ref 135–145)
Total Bilirubin: 0.6 mg/dL (ref 0.3–1.2)
Total Protein: 7.2 g/dL (ref 6.5–8.1)

## 2019-11-08 LAB — TROPONIN I (HIGH SENSITIVITY): Troponin I (High Sensitivity): <2 ng/L (ref <18)

## 2019-11-08 LAB — CBC
HCT: 42.4 % (ref 39.0–52.0)
Hemoglobin: 14.5 g/dL (ref 13.0–17.0)
MCH: 30.6 pg (ref 26.0–34.0)
MCHC: 34.2 g/dL (ref 30.0–36.0)
MCV: 89.5 fL (ref 80.0–100.0)
Platelets: 189 10*3/uL (ref 150–400)
RBC: 4.74 MIL/uL (ref 4.22–5.81)
RDW: 12.3 % (ref 11.5–15.5)
WBC: 8.2 10*3/uL (ref 4.0–10.5)
nRBC: 0 % (ref 0.0–0.2)

## 2019-11-08 MED ORDER — SODIUM CHLORIDE 0.9 % IV BOLUS
1000.0000 mL | Freq: Once | INTRAVENOUS | Status: AC
  Administered 2019-11-08: 1000 mL via INTRAVENOUS

## 2019-11-08 NOTE — ED Notes (Signed)
12-lead reading acute MI, given to MD Bero, no code STEMI per MD

## 2019-11-08 NOTE — ED Triage Notes (Signed)
Pt BIB [facility] had a witnessed seizure lasting 1-2 minutes. No seizure hx. Pt reports taking 4 xanax tonight, denies SI. EMS VSS.

## 2019-11-08 NOTE — ED Provider Notes (Signed)
[facility] Emergency Department Provider Note MRN:  [id number]  Arrival date & time: 11/08/19     Chief Complaint   Seizures   History of Present Illness   Brian Patterson is a 23 y.o. year-old male with a history of asthma presenting to the ED with chief complaint of seizures.  Patient reports having 4 Zani bars this evening.  Report of seizure followed by postictal state.  I was unable to obtain an accurate HPI, PMH, or ROS due to the patient's postictal state.  Level 5 caveat.  Review of Systems  Positive for Xanax use, seizure.  Patient's Health History    Past Medical History:  Diagnosis Date  . Asthma   . Non-traumatic rhabdomyolysis 04/13/2017   No concerns on exam. Likely trending down. STAT labs ordered below. Continue hydration.    History reviewed. No pertinent surgical history.  No family history on file.  Social History   Socioeconomic History  . Marital status: Single    Spouse name: Not on file  . Number of children: Not on file  . Years of education: Not on file  . Highest education level: Not on file  Occupational History  . Not on file  Tobacco Use  . Smoking status: Current Every Day Smoker  . Smokeless tobacco: Current User  Substance and Sexual Activity  . Alcohol use: No  . Drug use: No  . Sexual activity: Yes  Other Topics Concern  . Not on file  Social History Narrative  . Not on file   Social Determinants of Health   Financial Resource Strain:   . Difficulty of Paying Living Expenses: Not on file  Food Insecurity:   . Worried About Charity fundraiser in the Last Year: Not on file  . Ran Out of Food in the Last Year: Not on file  Transportation Needs:   . Lack of Transportation (Medical): Not on file  . Lack of Transportation (Non-Medical): Not on file  Physical Activity:   . Days of Exercise per Week: Not on file  . Minutes of Exercise per Session: Not on file  Stress:   . Feeling of Stress : Not on file   Social Connections:   . Frequency of Communication with Friends and Family: Not on file  . Frequency of Social Gatherings with Friends and Family: Not on file  . Attends Religious Services: Not on file  . Active Member of Clubs or Organizations: Not on file  . Attends Archivist Meetings: Not on file  . Marital Status: Not on file  Intimate Partner Violence:   . Fear of Current or Ex-Partner: Not on file  . Emotionally Abused: Not on file  . Physically Abused: Not on file  . Sexually Abused: Not on file     Physical Exam  Vital Signs and Nursing Notes reviewed Vitals:   11/08/19 2210  BP: 113/73  Resp: 13    CONSTITUTIONAL: Well-appearing, NAD NEURO: Somnolent, wakes to voice, follows commands, normal and symmetric strength and sensation. EYES:  eyes equal and reactive ENT/NECK:  no LAD, no JVD CARDIO: Regular rate, well-perfused, normal S1 and S2 PULM:  CTAB no wheezing or rhonchi GI/GU:  normal bowel sounds, non-distended, non-tender MSK/SPINE:  No gross deformities, no edema SKIN:  no rash, atraumatic PSYCH:  Appropriate speech and behavior  Diagnostic and Interventional Summary    EKG Interpretation  Date/Time:  11-08-2019 at 22: 09: 09 Ventricular Rate:  56 PR Interval: 151  QRS Duration: 98 QT Interval:  438 QTC Calculation: 423 R Axis:     Text Interpretation: Sinus rhythm, normal intervals, subtle ST elevation, likely benign early repolarization Confirmed by Dr. Kennis Carina at 10:40 PM      Labs Reviewed  CBC  COMPREHENSIVE METABOLIC PANEL  LACTIC ACID, PLASMA  URINALYSIS, ROUTINE W REFLEX MICROSCOPIC  RAPID URINE DRUG SCREEN, HOSP PERFORMED  TROPONIN I (HIGH SENSITIVITY)    XR Chest Single View  Final Result    CT Head Wo Contrast    (Results Pending)    Medications  sodium chloride 0.9 % bolus 1,000 mL (has no administration in time range)     Procedures  /  Critical Care Procedures  ED Course and Medical Decision Making  I  have reviewed the triage vital signs and the nursing notes.  Pertinent labs & imaging results that were available during my care of the patient were reviewed by me and considered in my medical decision making (see below for details).     First-time seizure, likely in the setting of drug abuse.  Will evaluate further with labs, urinalysis to rule out other secondary cause of seizure.  Will need to obtain CT head.  Signed out to oncoming provider at shift change.  Anticipating negative work-up, need for metabolization of Xanax, likely discharge.    Elmer Sow. Pilar Plate, MD [facility] [email or web address]  Final Clinical Impressions(s) / ED Diagnoses     ICD-10-CM   1. Seizure (HCC)  R56.9   2. Chest pressure  R07.89 XR Chest Single View    XR Chest Single View  3. Benzodiazepine overdose, accidental or unintentional, initial encounter  T42.4X1A     ED Discharge Orders    None       Discharge Instructions Discussed with and Provided to Patient:   Discharge Instructions   None       Sabas Sous, MD 11/08/19 2247

## 2019-11-08 NOTE — ED Notes (Signed)
Pt reports need to have a BM. D/t Recent witnessed seizure and xanax use, advised pt he would not be able to ambulate to BR at this time. Pt ref'd bedpan/bedside commode. Shortly thereafter pt defecated in stretcher. Pt cleaned up, new pants given. Pt reports he needed to walk to bathroom to complete BM. Risks explained. Pt ambulated to BR independently, explained to pull cord if needs anything.

## 2019-11-09 ENCOUNTER — Emergency Department (HOSPITAL_COMMUNITY): Payer: Self-pay

## 2019-11-09 LAB — TROPONIN I (HIGH SENSITIVITY): Troponin I (High Sensitivity): 5 ng/L (ref <18)

## 2019-11-09 NOTE — ED Provider Notes (Signed)
Patient signed out to me by Dr. Pilar Plate.  Patient had an episode earlier after taking an unknown quantity of Xanax that a bystander thought might have been a seizure.  He does not have a history of seizures.  He has been monitored for an extended period of time here in the ER and has not had anything that looks like seizures.  His work-up was entirely negative.  He is back to a normal neurologic baseline without complaints currently.  Patient denies intentional drug overdose and has no thoughts of self-harm.  Unclear if he actually had a seizure, will provide patient with outpatient follow-up.   Gilda Crease, MD 11/09/19 [phone]

## 2019-11-12 ENCOUNTER — Encounter: Payer: Self-pay | Admitting: Neurology

## 2020-01-16 ENCOUNTER — Ambulatory Visit: Payer: Self-pay | Admitting: Neurology

## 2020-04-25 ENCOUNTER — Emergency Department (HOSPITAL_BASED_OUTPATIENT_CLINIC_OR_DEPARTMENT_OTHER)
Admission: EM | Admit: 2020-04-25 | Discharge: 2020-04-25 | Disposition: A | Discharge status: Home / Self Care | Payer: Self-pay | Attending: Emergency Medicine | Admitting: Emergency Medicine

## 2020-04-25 ENCOUNTER — Other Ambulatory Visit: Payer: Self-pay

## 2020-04-25 ENCOUNTER — Encounter (HOSPITAL_BASED_OUTPATIENT_CLINIC_OR_DEPARTMENT_OTHER): Payer: Self-pay

## 2020-04-25 DIAGNOSIS — R300 Dysuria: Secondary | ICD-10-CM | POA: Insufficient documentation

## 2020-04-25 DIAGNOSIS — J45909 Unspecified asthma, uncomplicated: Secondary | ICD-10-CM | POA: Insufficient documentation

## 2020-04-25 DIAGNOSIS — F17210 Nicotine dependence, cigarettes, uncomplicated: Secondary | ICD-10-CM | POA: Insufficient documentation

## 2020-04-25 LAB — URINALYSIS, ROUTINE W REFLEX MICROSCOPIC
Bilirubin Urine: NEGATIVE
Glucose, UA: NEGATIVE mg/dL
Hgb urine dipstick: NEGATIVE
Ketones, ur: NEGATIVE mg/dL
Leukocytes,Ua: NEGATIVE
Nitrite: NEGATIVE
Protein, ur: NEGATIVE mg/dL
Specific Gravity, Urine: 1.025 (ref 1.005–1.030)
pH: 6 (ref 5.0–8.0)

## 2020-04-25 MED ORDER — CEFTRIAXONE SODIUM 500 MG IJ SOLR
500.0000 mg | Freq: Once | INTRAMUSCULAR | Status: AC
Start: 2020-04-25 — End: 2020-04-25
  Administered 2020-04-25: 500 mg via INTRAMUSCULAR
  Filled 2020-04-25: qty 500

## 2020-04-25 MED ORDER — DOXYCYCLINE HYCLATE 100 MG PO CAPS: 100.0000 mg | ORAL_CAPSULE | Freq: Two times a day (BID) | ORAL | 0 refills | Status: AC

## 2020-04-25 MED ORDER — LIDOCAINE HCL (PF) 1 % IJ SOLN
INTRAMUSCULAR | Status: AC
  Administered 2020-04-25: 1 mL
  Filled 2020-04-25: qty 5

## 2020-04-25 MED ORDER — DOXYCYCLINE HYCLATE 100 MG PO TABS
100.0000 mg | ORAL_TABLET | Freq: Once | ORAL | Status: AC
  Administered 2020-04-25: 100 mg via ORAL
  Filled 2020-04-25: qty 1

## 2020-04-25 NOTE — ED Triage Notes (Signed)
Pt c/o dysuria and penile d/c x 1 week-NAD-steady gait

## 2020-04-25 NOTE — Discharge Instructions (Signed)
Thank you for allowing [place]  to provide your care today in the emergency department.  Your gonorrhea and chlamydia testing is pending.  You were given a shot here in the emergency department and will be discharged home with a prescription for doxycycline.  If your testing comes back negative you can stop the doxycycline.  If it is positive continue pills until prescription is complete.   If any of these tests are positive, someone from the hospital will call you to inform you. You can also download the my chart application and use the number on your discharge paperwork to register for an account.  Lab results are typically available in the app 72 hours after they have resulted.  [facility] recommends abstaining from all sexual activities for 7 days after being treated.  If any of your tests are positive, it is important that you let all of your sexual partners know so they can seek treatment.  It is also important to note that if you were treated and have sex with someone that has not been treated that you can be reinfected.  Return to the emergency department if you develop significantly worsening symptoms such as fever, chills, severe pain, redness, or swelling to the penis or testicles, or if the discharge from your penis does not improve.

## 2020-04-25 NOTE — ED Notes (Signed)
ED Provider at bedside. 

## 2020-04-25 NOTE — ED Provider Notes (Signed)
[facility] EMERGENCY DEPARTMENT Provider Note   CSN: [id number] Arrival date & time: 04/25/20  1617     History Chief Complaint  Patient presents with  . Dysuria    Brian Patterson is a 24 y.o. male with past medical history significant for asthma and STD in the past that was treated.  HPI Presents to emergency department today with chief complaint of dysuria x1 week.  When he describes his symptoms to me he states he is peeing more frequently.  He denies any pain with urination.  He denies seeing any blood in his urine.  He noticed clear discharge from his penis today.  He sexually active with one male partner without protection.  He denies any fever, chills, abdominal pain, nausea, vomiting, back pain, testicular pain or swelling. Also denies any polydipsia or history of diabetes.    Past Medical History:  Diagnosis Date  . Asthma   . Non-traumatic rhabdomyolysis 04/13/2017   No concerns on exam. Likely trending down. STAT labs ordered below. Continue hydration.    Patient Active Problem List   Diagnosis Date Noted  . STD exposure 05/24/2017    History reviewed. No pertinent surgical history.     No family history on file.  Social History   Tobacco Use  . Smoking status: Current Every Day Smoker    Types: Cigarettes  . Smokeless tobacco: Never Used  Vaping Use  . Vaping Use: Never used  Substance Use Topics  . Alcohol use: No  . Drug use: Yes    Types: Marijuana    Home Medications Prior to Admission medications   Medication Sig Start Date End Date Taking? Authorizing Provider  doxycycline (VIBRAMYCIN) 100 MG capsule Take 1 capsule (100 mg total) by mouth 2 (two) times daily for 7 days. 04/25/20 05/02/20  Terrance Usery, Harley Hallmark, PA-C    Allergies    Peanut-containing drug products  Review of Systems   Review of Systems  All other systems are reviewed and are negative for acute change except as noted in the HPI.   Physical Exam Updated Vital  Signs BP 121/69 (BP Location: Left Arm)   Pulse 79   Temp 99 F (37.2 C) (Oral)   Resp 18   Ht 5\' 9"  (1.753 m)   Wt 74.4 kg   SpO2 100%   BMI 24.22 kg/m   Physical Exam Vitals and nursing note reviewed.  Constitutional:      Appearance: He is well-developed. He is not ill-appearing or toxic-appearing.  HENT:     Head: Normocephalic and atraumatic.     Nose: Nose normal.  Eyes:     General: No scleral icterus.       Right eye: No discharge.        Left eye: No discharge.     Conjunctiva/sclera: Conjunctivae normal.  Neck:     Vascular: No JVD.  Cardiovascular:     Rate and Rhythm: Normal rate and regular rhythm.     Pulses: Normal pulses.     Heart sounds: Normal heart sounds.  Pulmonary:     Effort: Pulmonary effort is normal.     Breath sounds: Normal breath sounds.  Abdominal:     General: There is no distension.  Genitourinary:    Comments: Careers adviser present for exam. No discharge or urethritis noted. No signs of sores or lesions or erythema on the penis or testicles. The penis and testicles are nontender. No testicular masses or swelling. No scrotal swelling. No  signs of any inguinal hernias. Cremaster reflex present bilaterally.   Musculoskeletal:        General: Normal range of motion.     Cervical back: Normal range of motion.  Skin:    General: Skin is warm and dry.  Neurological:     Mental Status: He is oriented to person, place, and time.     GCS: GCS eye subscore is 4. GCS verbal subscore is 5. GCS motor subscore is 6.     Comments: Fluent speech, no facial droop.  Psychiatric:        Behavior: Behavior normal.     ED Results / Procedures / Treatments   Labs (all labs ordered are listed, but only abnormal results are displayed) Labs Reviewed  URINALYSIS, ROUTINE W REFLEX MICROSCOPIC  GC/CHLAMYDIA PROBE AMP ([place]) NOT AT [facility]    EKG None  Radiology No results found.  Procedures Procedures (including critical care  time)  Medications Ordered in ED Medications  cefTRIAXone (ROCEPHIN) injection 500 mg (500 mg Intramuscular Given 04/25/20 1730)  doxycycline (VIBRA-TABS) tablet 100 mg (100 mg Oral Given 04/25/20 1731)  lidocaine (PF) (XYLOCAINE) 1 % injection (1 mL  Given 04/25/20 1731)    ED Course  I have reviewed the triage vital signs and the nursing notes.  Pertinent labs & imaging results that were available during my care of the patient were reviewed by me and considered in my medical decision making (see chart for details).    MDM Rules/Calculators/A&P                          History provided by patient with additional history obtained from chart review.    Patient presenting with chief complaint of dysuria however during history and exam patient is patient is admitting to urinary frequency and penile discharge without dysuria. He is afebrile without abdominal tenderness, abdominal pain or painful bowel movements to indicate prostatitis.  No tenderness to palpation of the testes or epididymis to suggest orchitis or epididymitis.  STD cultures obtained including gonorrhea and chlamydia. He declines HIV and syphilis testing. UA is normal, no signs of urinary tract infection, no bacteria. Patient to be discharged with instructions to follow up with PCP. Discussed importance of using protection when sexually active. Pt understands that they have GC/Chlamydia cultures pending and that they will need to inform all sexual partners if results return positive. Patient has been treated prophylactically with Rocephin and given dose of doxycycline in the ED with prescription sent to pharmacy for doxycycline per CDC guidelines. Strict return precautions discussed. Recommend pcp follow up if symptoms persist.    Portions of this note were generated with Dragon dictation software. Dictation errors may occur despite best attempts at proofreading.  Final Clinical Impression(s) / ED Diagnoses Final diagnoses:    Dysuria    Rx / DC Orders ED Discharge Orders         Ordered    doxycycline (VIBRAMYCIN) 100 MG capsule  2 times daily     Discontinue  Reprint     04/25/20 1726           Sherene Sires, PA-C 04/25/20 1808    Tilden Fossa, MD 04/26/20 1056

## 2021-06-04 ENCOUNTER — Encounter (HOSPITAL_BASED_OUTPATIENT_CLINIC_OR_DEPARTMENT_OTHER): Payer: Self-pay | Admitting: *Deleted

## 2021-06-04 ENCOUNTER — Other Ambulatory Visit: Payer: Self-pay

## 2021-06-04 ENCOUNTER — Emergency Department (HOSPITAL_BASED_OUTPATIENT_CLINIC_OR_DEPARTMENT_OTHER)
Admission: EM | Admit: 2021-06-04 | Discharge: 2021-06-04 | Disposition: A | Discharge status: Home / Self Care | Payer: No Typology Code available for payment source | Attending: Emergency Medicine | Admitting: Emergency Medicine

## 2021-06-04 DIAGNOSIS — Y990 Civilian activity done for income or pay: Secondary | ICD-10-CM | POA: Diagnosis not present

## 2021-06-04 DIAGNOSIS — W208XXA Other cause of strike by thrown, projected or falling object, initial encounter: Secondary | ICD-10-CM | POA: Diagnosis not present

## 2021-06-04 DIAGNOSIS — F17210 Nicotine dependence, cigarettes, uncomplicated: Secondary | ICD-10-CM | POA: Diagnosis not present

## 2021-06-04 DIAGNOSIS — S0181XA Laceration without foreign body of other part of head, initial encounter: Secondary | ICD-10-CM | POA: Insufficient documentation

## 2021-06-04 DIAGNOSIS — Z23 Encounter for immunization: Secondary | ICD-10-CM | POA: Diagnosis not present

## 2021-06-04 DIAGNOSIS — J45909 Unspecified asthma, uncomplicated: Secondary | ICD-10-CM | POA: Insufficient documentation

## 2021-06-04 DIAGNOSIS — S0993XA Unspecified injury of face, initial encounter: Secondary | ICD-10-CM | POA: Diagnosis present

## 2021-06-04 MED ORDER — TETANUS-DIPHTH-ACELL PERTUSSIS 5-2.5-18.5 LF-MCG/0.5 IM SUSY
0.5000 mL | PREFILLED_SYRINGE | Freq: Once | INTRAMUSCULAR | Status: AC
  Administered 2021-06-04: 0.5 mL via INTRAMUSCULAR
  Filled 2021-06-04: qty 0.5

## 2021-06-04 MED ORDER — LIDOCAINE-EPINEPHRINE (PF) 2 %-1:200000 IJ SOLN
10.0000 mL | Freq: Once | INTRAMUSCULAR | Status: AC
  Administered 2021-06-04: 10 mL
  Filled 2021-06-04: qty 20

## 2021-06-04 NOTE — ED Notes (Signed)
PA at bedside suturing.

## 2021-06-04 NOTE — ED Provider Notes (Signed)
[facility] EMERGENCY DEPARTMENT Provider Note   CSN: [id number] Arrival date & time: 06/04/21  1844     History Chief Complaint  Patient presents with   Laceration    Brian Patterson is a 25 y.o. male who presents to the ED today with complaint of laceration to R forehead/bridge of nose that occurred about 2 hours PTA.  He reports he was working on a car and changing the oil from down below when he dropped a wrench causing it to fall on his forehead single laceration.  He denies any loss of consciousness.  He is not anticoagulated.  He is unsure regarding tetanus status at this time.  He reports bleeding has been controlled with pressure dressing.  No other complaints at this time.  Denies any nausea, vomiting, blurry vision, confusion.  The history is provided by the patient and medical records.      Past Medical History:  Diagnosis Date   Asthma    Non-traumatic rhabdomyolysis 04/13/2017   No concerns on exam. Likely trending down. STAT labs ordered below. Continue hydration.    Patient Active Problem List   Diagnosis Date Noted   STD exposure 05/24/2017    History reviewed. No pertinent surgical history.     No family history on file.  Social History   Tobacco Use   Smoking status: Every Day    Types: Cigarettes   Smokeless tobacco: Never  Vaping Use   Vaping Use: Never used  Substance Use Topics   Alcohol use: No   Drug use: Yes    Types: Marijuana    Home Medications Prior to Admission medications   Not on File    Allergies    Peanut-containing drug products  Review of Systems   Review of Systems  Constitutional:  Negative for chills and fever.  Skin:  Positive for wound.  Neurological:  Negative for syncope and headaches.  All other systems reviewed and are negative.  Physical Exam Updated Vital Signs BP 126/87 (BP Location: Left Arm)   Pulse 74   Temp 98.5 F (36.9 C) (Oral)   Resp 16   Ht 5\' 9"  (1.753 m)   Wt 73.5 kg   SpO2  100%   BMI 23.92 kg/m   Physical Exam Vitals and nursing note reviewed.  Constitutional:      Appearance: He is not ill-appearing.  HENT:     Head: Normocephalic.     Comments: 1.5 cm laceration to R forehead just medial to the eyebrow, bleeding controlled Eyes:     Extraocular Movements: Extraocular movements intact.     Conjunctiva/sclera: Conjunctivae normal.     Pupils: Pupils are equal, round, and reactive to light.     Comments: No hyphema appreciated. EOMI.   Cardiovascular:     Rate and Rhythm: Normal rate and regular rhythm.     Pulses: Normal pulses.  Pulmonary:     Effort: Pulmonary effort is normal.     Breath sounds: Normal breath sounds. No wheezing, rhonchi or rales.  Skin:    General: Skin is warm and dry.     Coloration: Skin is not jaundiced.  Neurological:     Mental Status: He is alert.    ED Results / Procedures / Treatments   Labs (all labs ordered are listed, but only abnormal results are displayed) Labs Reviewed - No data to display  EKG None  Radiology No results found.  Procedures . Laceration Repair  Date/Time: 06/04/2021 9:01 PM Performed by: 06/06/2021,  Jameela Michna, PA-C Authorized by: Tanda Rockers, PA-C   Consent:    Consent obtained:  Verbal   Consent given by:  Patient   Risks discussed:  Infection, pain and poor cosmetic result Universal protocol:    Patient identity confirmed:  Verbally with patient Anesthesia:    Anesthesia method:  Local infiltration   Local anesthetic:  Lidocaine 2% WITH epi Laceration details:    Location:  Face   Face location:  Forehead   Length (cm):  1.5   Depth (mm):  2 Pre-procedure details:    Preparation:  Patient was prepped and draped in usual sterile fashion Treatment:    Area cleansed with:  Povidone-iodine   Irrigation solution:  Sterile saline Skin repair:    Repair method:  Sutures   Suture size:  6-0   Suture material:  Prolene   Suture technique:  Simple interrupted   Number of  sutures:  3 Approximation:    Approximation:  Close Repair type:    Repair type:  Intermediate Post-procedure details:    Dressing:  Open (no dressing)   Procedure completion:  Tolerated well, no immediate complications   Medications Ordered in ED Medications  lidocaine-EPINEPHrine (XYLOCAINE W/EPI) 2 %-1:200000 (PF) injection 10 mL (10 mLs Infiltration Given by Other 06/04/21 2026)  Tdap (BOOSTRIX) injection 0.5 mL (0.5 mLs Intramuscular Given 06/04/21 2026)    ED Course  I have reviewed the triage vital signs and the nursing notes.  Pertinent labs & imaging results that were available during my care of the patient were reviewed by me and considered in my medical decision making (see chart for details).    MDM Rules/Calculators/A&P                           25 year old male who presents to the ED today after sustaining a laceration to his right forehead/bridge of nose approximately 2 hours prior to arrival where a branch fell and hit him in the forehead.  No loss of consciousness.  Bleeding controlled at this time.  Tetanus status unknown.  On my exam he is a 1.5 cm laceration that will require sutures.  Denies any concussive type symptoms and is not anticoagulated, do not feel he requires a CT head at this time.   Laceration repaired without difficulty.  Patient instructed to return to the ED in 5 days for suture removal.  He is instructed to return sooner for any signs of infection.  He is in agreement with plan and stable for discharge.  This note was prepared using Dragon voice recognition software and may include unintentional dictation errors due to the inherent limitations of voice recognition software.   Final Clinical Impression(s) / ED Diagnoses Final diagnoses:  Facial laceration, initial encounter    Rx / DC Orders ED Discharge Orders     None        Discharge Instructions      Please return to the ED in 5 days time for suture removal. Return sooner for any  signs of infection including redness/swelling around the wound, drainage of pus, fevers > 100.4, chills.   Keep wound clean and dry. Wash with soap and water regularly.  Follow up with your PCP regarding ED visit       Tanda Rockers, Cordelia Poche 06/04/21 2104    Charlynne Pander, MD 06/04/21 [phone]

## 2021-06-04 NOTE — ED Triage Notes (Addendum)
C/o lac to forehead  hit in head by metal wrench while changing oil

## 2021-06-04 NOTE — Discharge Instructions (Addendum)
Please return to the ED in 5 days time for suture removal. Return sooner for any signs of infection including redness/swelling around the wound, drainage of pus, fevers > 100.4, chills.   Keep wound clean and dry. Wash with soap and water regularly.  Follow up with your PCP regarding ED visit

## 2021-06-11 ENCOUNTER — Encounter (HOSPITAL_BASED_OUTPATIENT_CLINIC_OR_DEPARTMENT_OTHER): Payer: Self-pay

## 2021-06-11 ENCOUNTER — Emergency Department (HOSPITAL_BASED_OUTPATIENT_CLINIC_OR_DEPARTMENT_OTHER)
Admission: EM | Admit: 2021-06-11 | Discharge: 2021-06-11 | Disposition: A | Discharge status: Home / Self Care | Payer: No Typology Code available for payment source | Attending: Emergency Medicine | Admitting: Emergency Medicine

## 2021-06-11 ENCOUNTER — Other Ambulatory Visit: Payer: Self-pay

## 2021-06-11 DIAGNOSIS — J45909 Unspecified asthma, uncomplicated: Secondary | ICD-10-CM | POA: Diagnosis not present

## 2021-06-11 DIAGNOSIS — Z91010 Allergy to peanuts: Secondary | ICD-10-CM | POA: Diagnosis not present

## 2021-06-11 DIAGNOSIS — F17210 Nicotine dependence, cigarettes, uncomplicated: Secondary | ICD-10-CM | POA: Diagnosis not present

## 2021-06-11 DIAGNOSIS — Z4802 Encounter for removal of sutures: Secondary | ICD-10-CM | POA: Insufficient documentation

## 2021-06-11 NOTE — ED Triage Notes (Signed)
Here for suture removal to forehead.

## 2021-06-11 NOTE — Discharge Instructions (Addendum)
May use over-the-counter Neosporin to area.  Try not to rub this area as the skin is delicate follow-up with primary care provider in 1 to 2 days for reevaluation.  Return for new or worsening symptoms

## 2021-06-11 NOTE — ED Provider Notes (Signed)
[facility] EMERGENCY DEPARTMENT Provider Note   CSN: [id number] Arrival date & time: 06/11/21  1851     History Chief Complaint  Patient presents with   Suture / Staple Removal    Brian Patterson is a 25 y.o. male for suture removal.  Sutures placed approximately 6 days ago after laceration to right eyebrow.  No bleeding, drainage, headache, redness, warmth.  Denies additional aggravating or alleviating factors.  No pain.  History obtained from patient and past medical records.  No interpreter used.  HPI     Past Medical History:  Diagnosis Date   Asthma    Non-traumatic rhabdomyolysis 04/13/2017   No concerns on exam. Likely trending down. STAT labs ordered below. Continue hydration.    Patient Active Problem List   Diagnosis Date Noted   STD exposure 05/24/2017    History reviewed. No pertinent surgical history.     History reviewed. No pertinent family history.  Social History   Tobacco Use   Smoking status: Every Day    Types: Cigarettes   Smokeless tobacco: Never  Vaping Use   Vaping Use: Never used  Substance Use Topics   Alcohol use: No   Drug use: Yes    Types: Marijuana    Home Medications Prior to Admission medications   Not on File    Allergies    Peanut-containing drug products  Review of Systems   Review of Systems  Constitutional: Negative.   HENT: Negative.    Respiratory: Negative.    Cardiovascular: Negative.   Gastrointestinal: Negative.   Genitourinary: Negative.   Musculoskeletal: Negative.   Skin:  Positive for wound.  Neurological: Negative.   All other systems reviewed and are negative.  Physical Exam Updated Vital Signs BP 117/73 (BP Location: Left Arm)   Pulse 73   Temp 98.3 F (36.8 C) (Oral)   Resp 16   Ht 5\' 9"  (1.753 m)   Wt 73.5 kg   SpO2 99%   BMI 23.92 kg/m   Physical Exam Vitals and nursing note reviewed.  Constitutional:      General: He is not in acute distress.    Appearance: He is  well-developed. He is not ill-appearing, toxic-appearing or diaphoretic.  HENT:     Head: Normocephalic.     Comments: 3 sutures to the forehead.  Eyes:     Pupils: Pupils are equal, round, and reactive to light.  Cardiovascular:     Rate and Rhythm: Normal rate and regular rhythm.  Pulmonary:     Effort: Pulmonary effort is normal. No respiratory distress.  Abdominal:     General: There is no distension.     Palpations: Abdomen is soft.  Musculoskeletal:        General: Normal range of motion.     Cervical back: Normal range of motion and neck supple.  Skin:    General: Skin is warm and dry.     Capillary Refill: Capillary refill takes less than 2 seconds.     Comments: No edema, erythema or warmth.  No bleeding, drainage  Neurological:     General: No focal deficit present.     Mental Status: He is alert and oriented to person, place, and time.    ED Results / Procedures / Treatments   Labs (all labs ordered are listed, but only abnormal results are displayed) Labs Reviewed - No data to display  EKG None  Radiology No results found.  Procedures .Suture Removal  Date/Time: 06/11/2021 7:26  PM Performed by: Linwood Dibbles, PA-C Authorized by: Linwood Dibbles, PA-C   Consent:    Consent obtained:  Verbal   Consent given by:  Patient   Risks, benefits, and alternatives were discussed: yes     Risks discussed:  Bleeding, pain and wound separation   Alternatives discussed:  No treatment, delayed treatment, observation, referral and alternative treatment Universal protocol:    Procedure explained and questions answered to patient or proxy's satisfaction: yes     Relevant documents present and verified: yes     Test results available: yes     Imaging studies available: yes     Required blood products, implants, devices, and special equipment available: yes     Site/side marked: yes     Immediately prior to procedure, a time out was called: yes     Patient  identity confirmed:  Verbally with patient Location:    Location:  Head/neck   Head/neck location:  Forehead Procedure details:    Wound appearance:  No signs of infection, good wound healing and clean   Number of sutures removed:  3   Number of staples removed:  0 Post-procedure details:    Post-removal:  No dressing applied   Procedure completion:  Tolerated well, no immediate complications   Medications Ordered in ED Medications - No data to display  ED Course  I have reviewed the triage vital signs and the nursing notes.  Pertinent labs & imaging results that were available during my care of the patient were reviewed by me and considered in my medical decision making (see chart for details).   Pt to ER for staple/suture removal and wound check as above. Procedure tolerated well. Vitals normal, no signs of infection. Scar minimization & return precautions given at dc.    The patient has been appropriately medically screened and/or stabilized in the ED. I have low suspicion for any other emergent medical condition which would require further screening, evaluation or treatment in the ED or require inpatient management.  Patient is hemodynamically stable and in no acute distress.  Patient able to ambulate in department prior to ED.  Evaluation does not show acute pathology that would require ongoing or additional emergent interventions while in the emergency department or further inpatient treatment.  I have discussed the diagnosis with the patient and answered all questions.  Pain is been managed while in the emergency department and patient has no further complaints prior to discharge.  Patient is comfortable with plan discussed in room and is stable for discharge at this time.  I have discussed strict return precautions for returning to the emergency department.  Patient was encouraged to follow-up with PCP/specialist refer to at discharge.     MDM Rules/Calculators/A&P                             Final Clinical Impression(s) / ED Diagnoses Final diagnoses:  Visit for suture removal    Rx / DC Orders ED Discharge Orders     None        Elston Aldape A, PA-C 06/11/21 1927    Maia Plan, MD 06/16/21 1001
